# Patient Record
Sex: Male | Born: 1965 | Hispanic: No | Marital: Married | Smoking: Former smoker
Health system: Southern US, Community
[De-identification: ages and names within clinical notes are randomized; demographics above are authoritative.]

---

## 2005-04-26 ENCOUNTER — Ambulatory Visit (HOSPITAL_COMMUNITY): Admission: RE | Admit: 2005-04-26 | Discharge: 2005-04-26 | Payer: Self-pay | Admitting: *Deleted

## 2005-11-25 IMAGING — US US ABDOMEN COMPLETE
1 series · 14 of 25 positions shown · non-contrast
Comparison: None.

CLINICAL DATA: Chronic hepatitis.  
 UPPER ABDOMINAL ULTRASOUND COMPLETE:

[Series 1: unknown · 0.25mm/px · 14 of 90 slices shown]
[im 1/90]
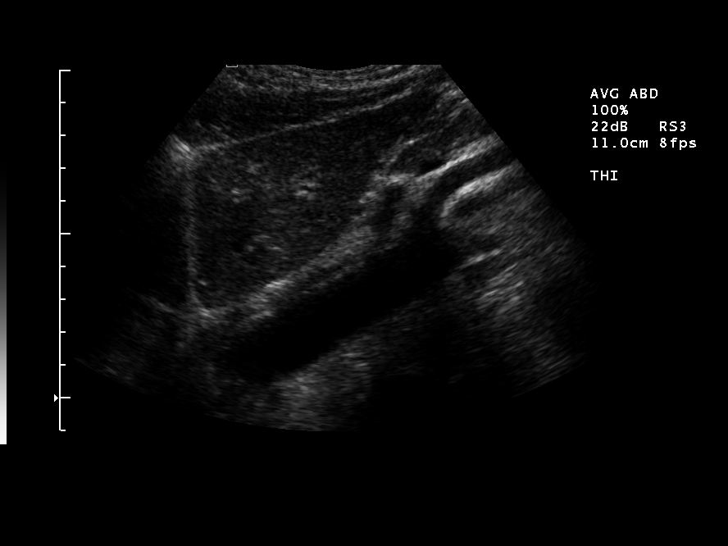
[im 8/90]
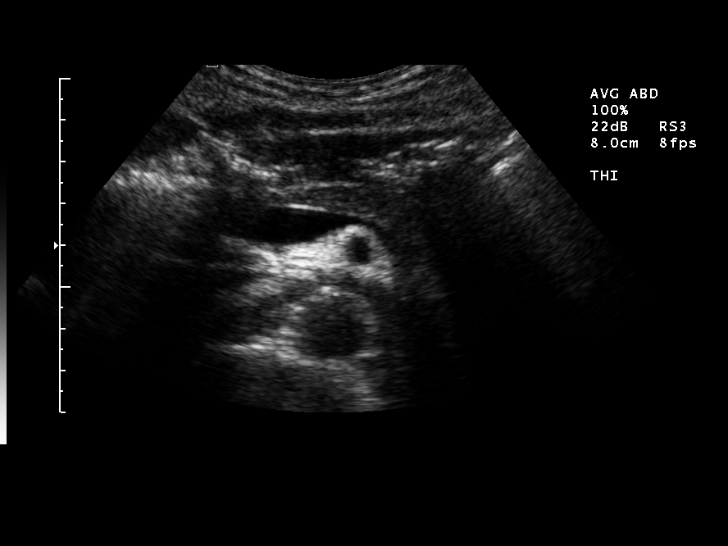
[im 15/90]
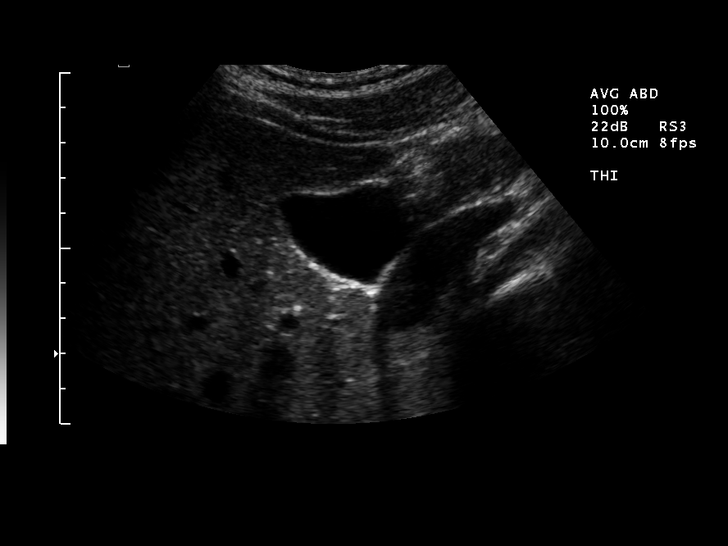
[im 23/90]
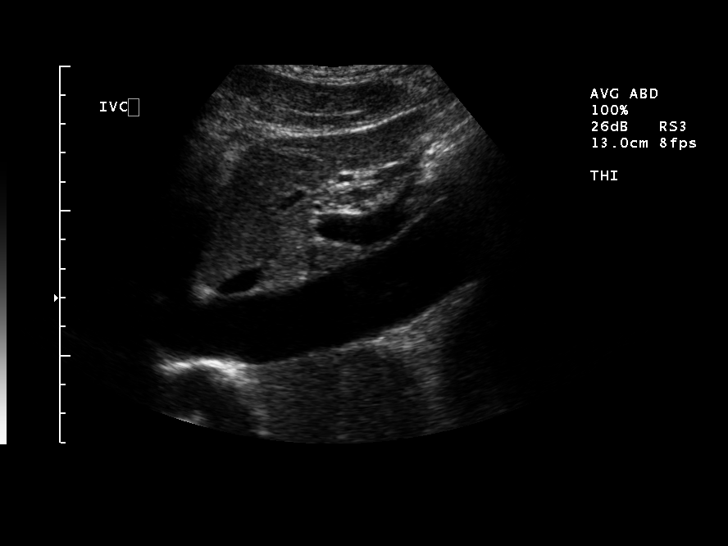
[im 30/90]
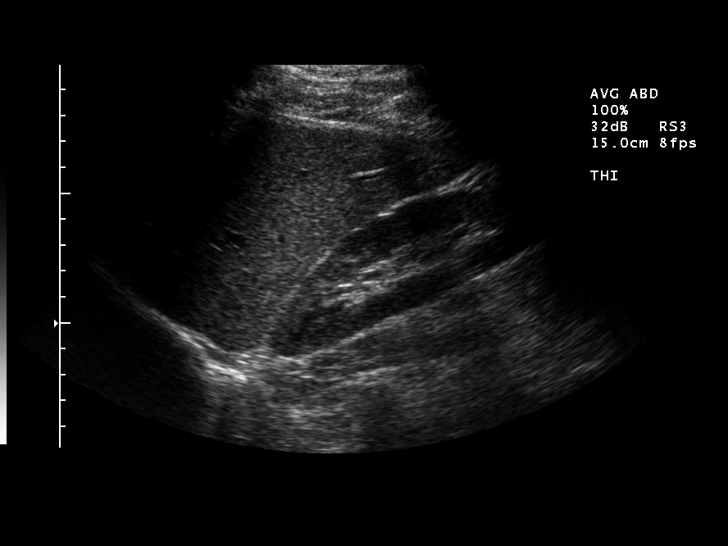
[im 34/90]
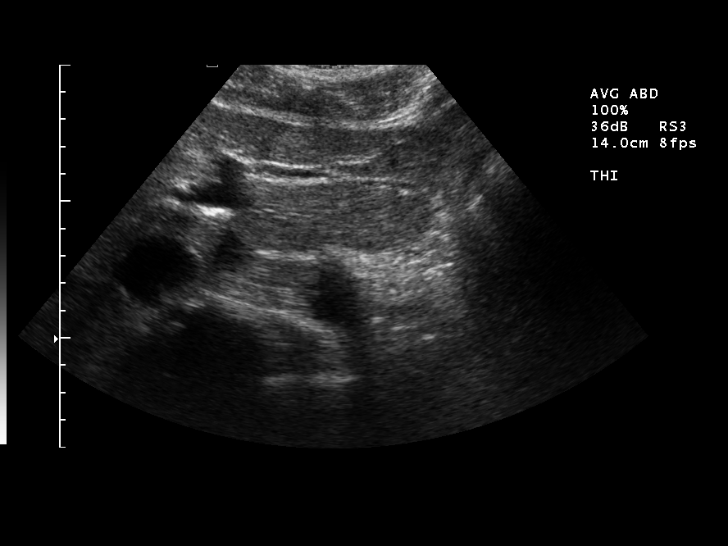
[im 41/90]
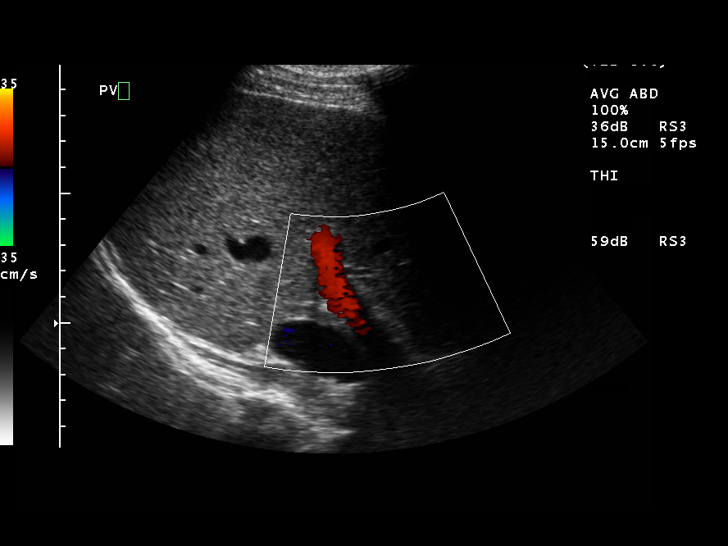
[im 49/90]
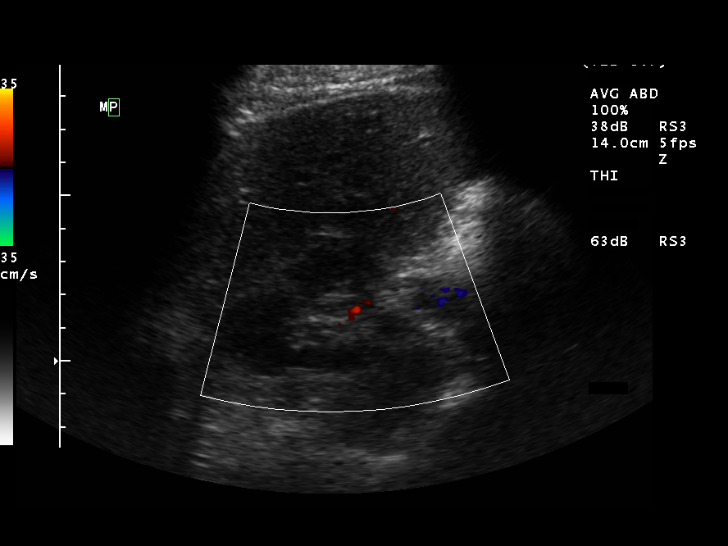
[im 56/90]
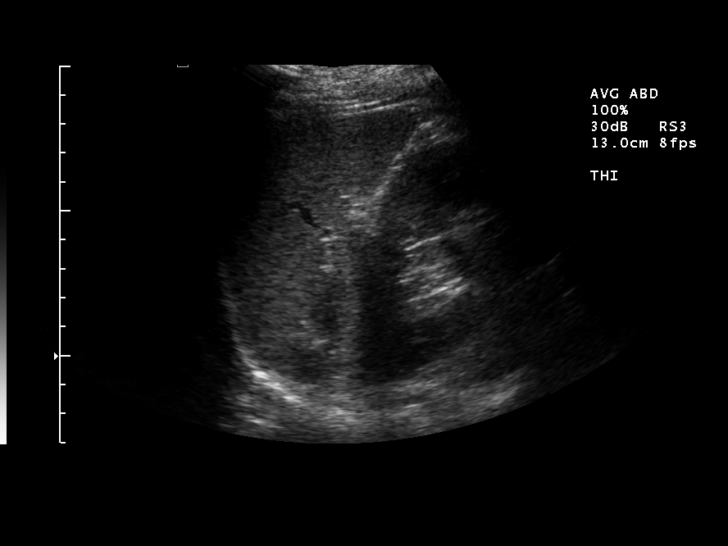
[im 60/90]
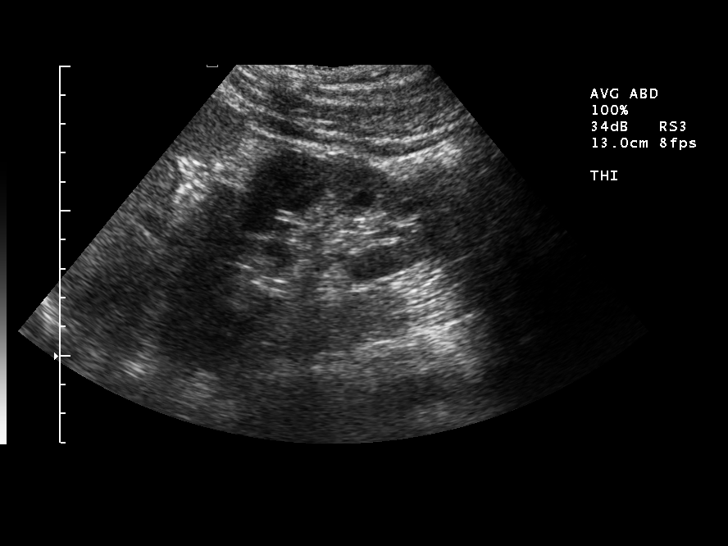
[im 67/90]
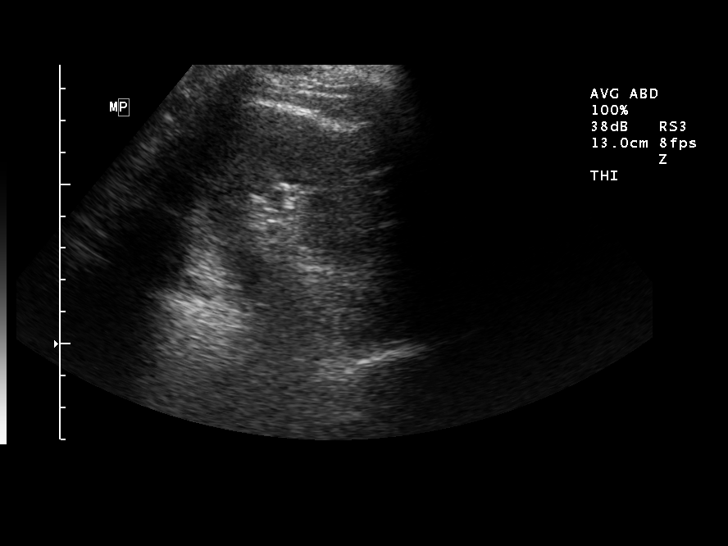
[im 75/90]
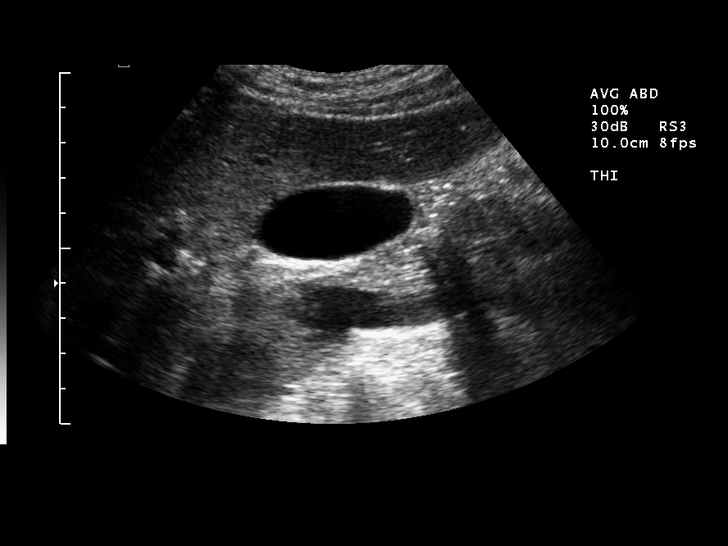
[im 82/90]
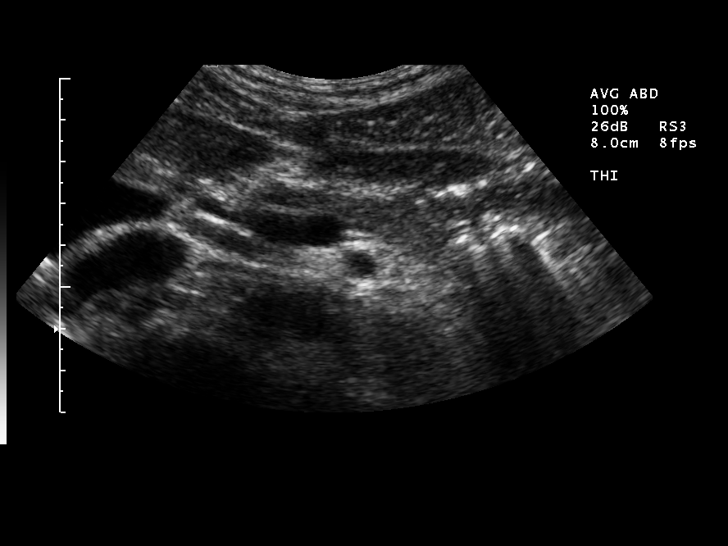
[im 90/90]
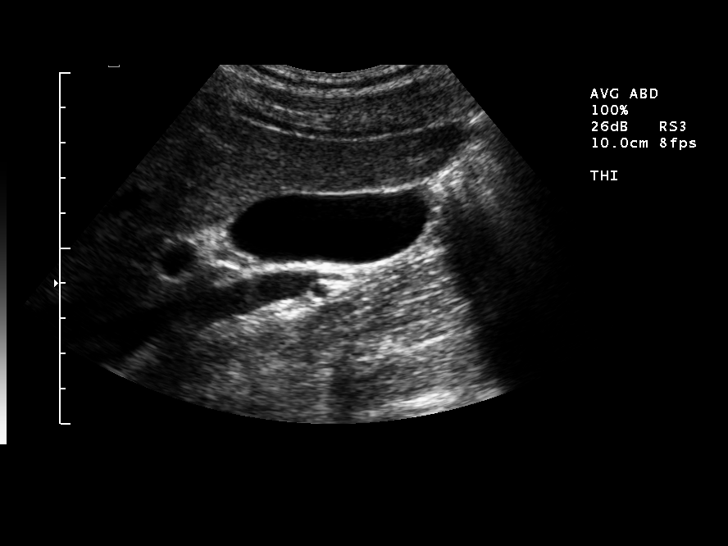

[14 of 25 positions shown; findings below may reference images not displayed]

FINDINGS: Gallbladder and biliary tree normal.  The common duct is 3.3 mm.  There appear to be no focal lesions of the liver. Echotexture is within normal limits.  Spleen, pancreas and kidneys normal.  IVC and aorta normal.  No ascites.
IMPRESSION: No acute or significant findings.

## 2009-05-15 IMAGING — US US ABDOMEN COMPLETE
1 series · 14 of 25 positions shown · non-contrast
Comparison: NONE

CLINICAL DATA: Hepatitis B. Prior study dated 01/15/02. 

ABDOMINAL ULTRASOUND

[Series 1: us abd · 0.23mm/px · 14 of 67 slices shown]
[im 1/67]
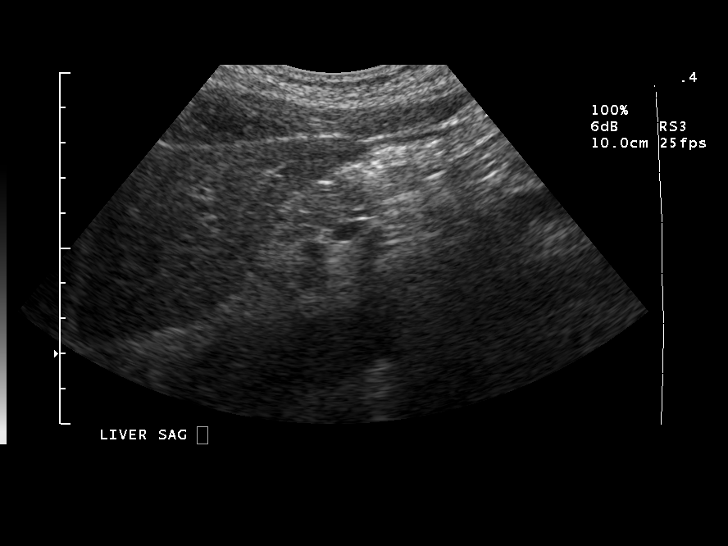
[im 6/67]
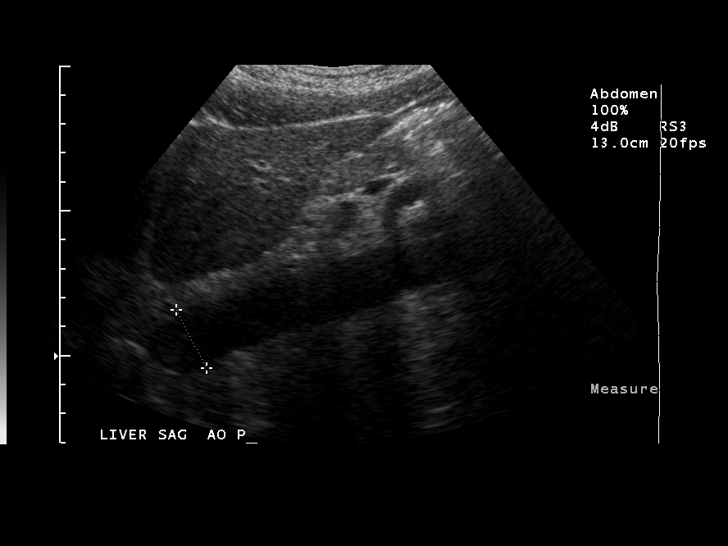
[im 12/67]
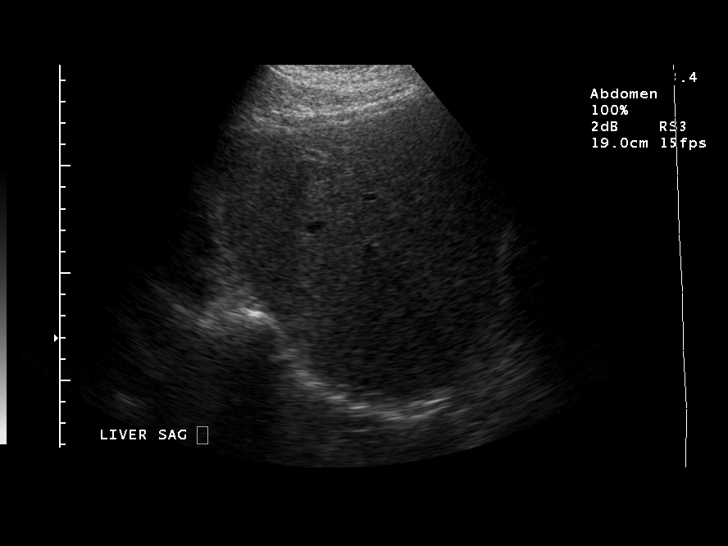
[im 17/67]
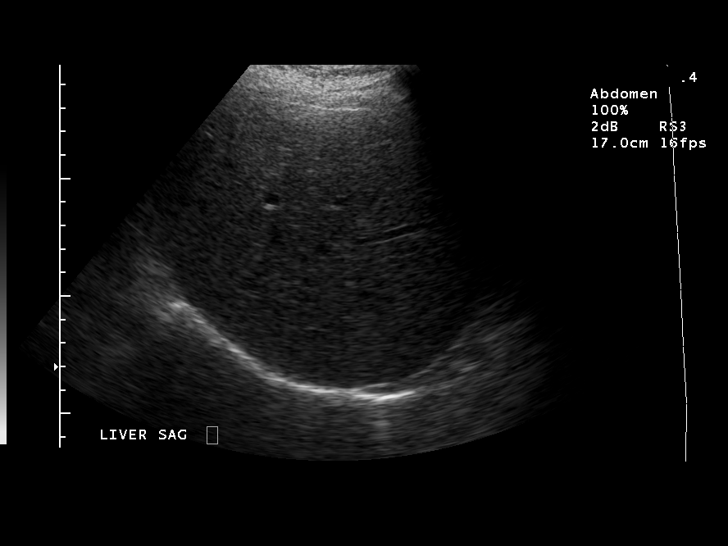
[im 23/67]
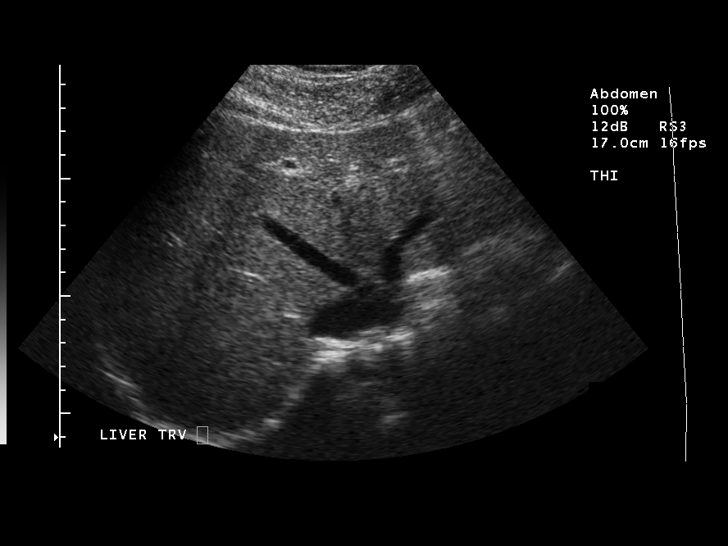
[im 25/67]
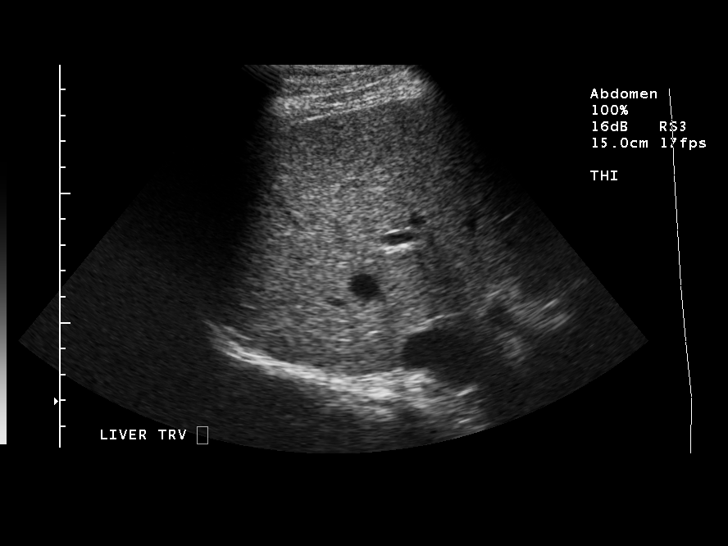
[im 31/67]
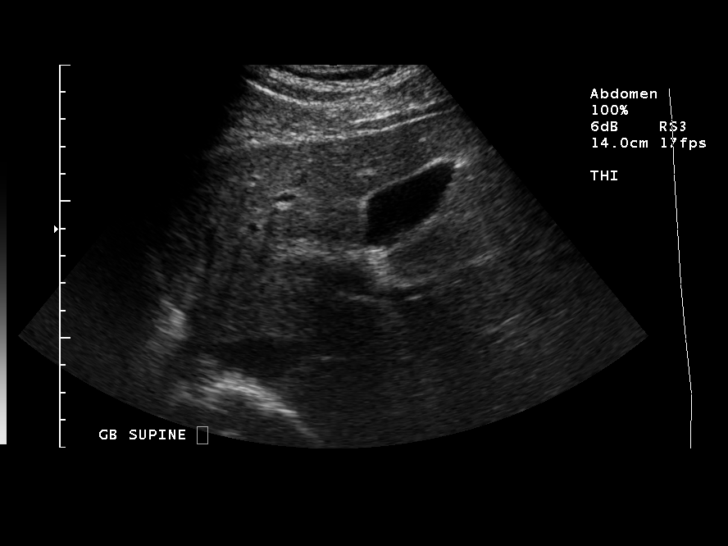
[im 36/67]
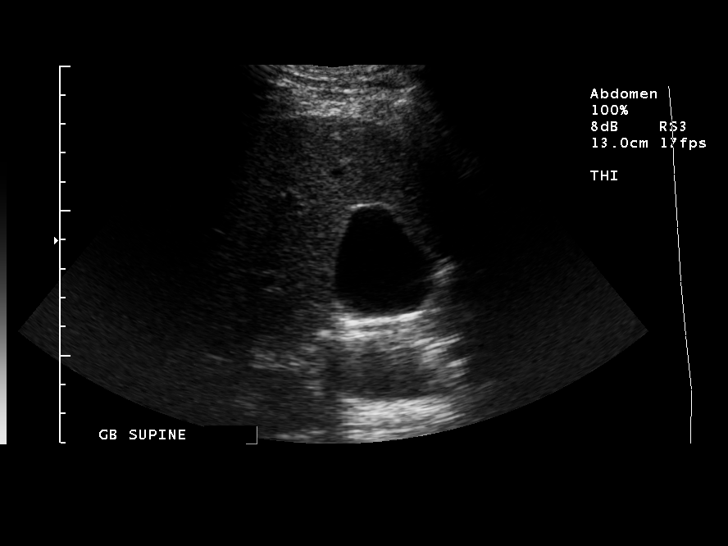
[im 42/67]
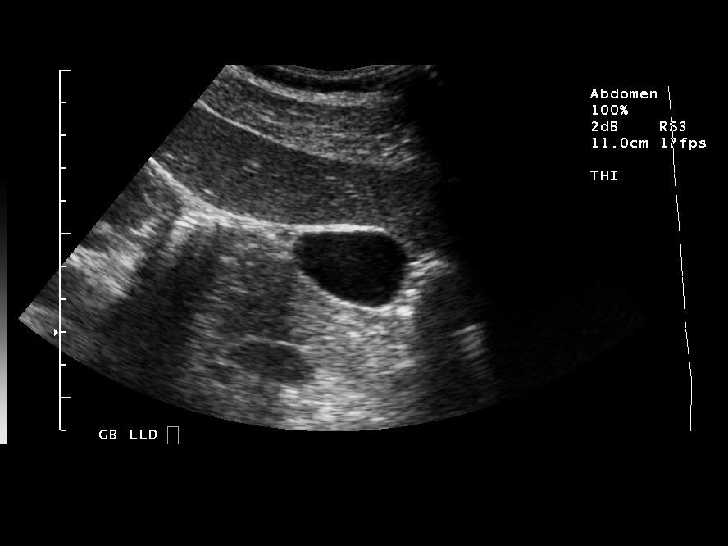
[im 45/67]
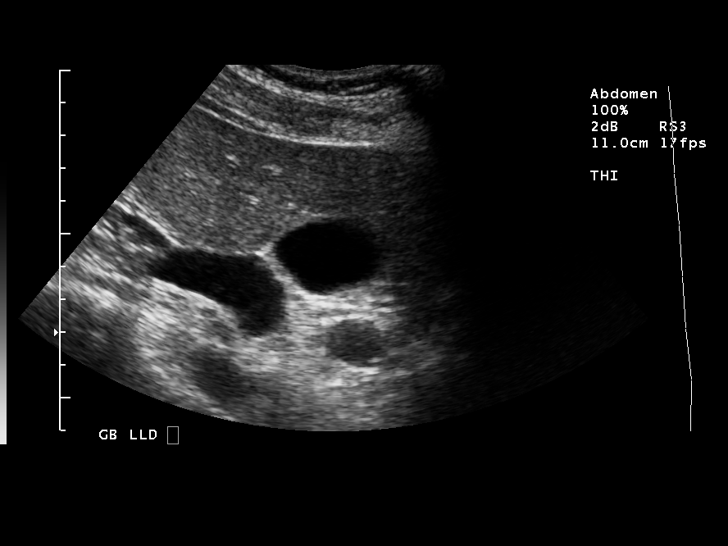
[im 50/67]
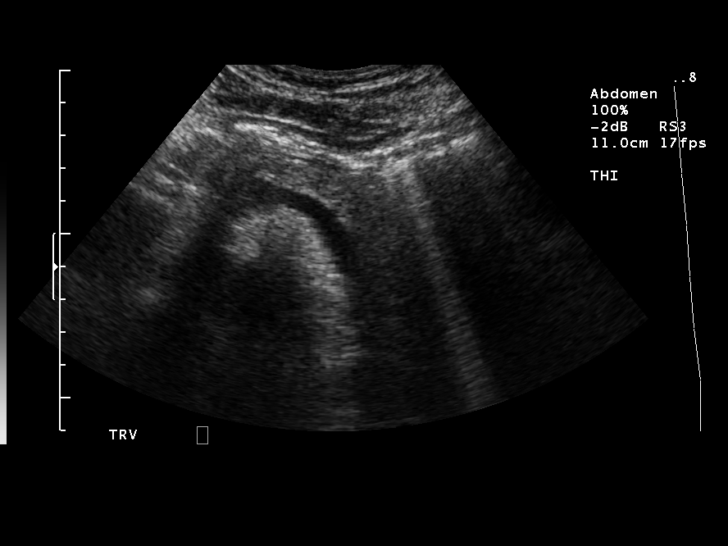
[im 56/67]
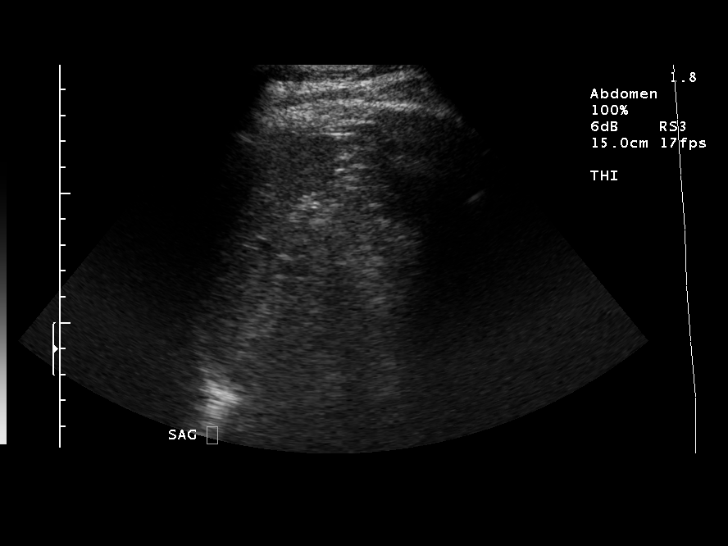
[im 61/67]
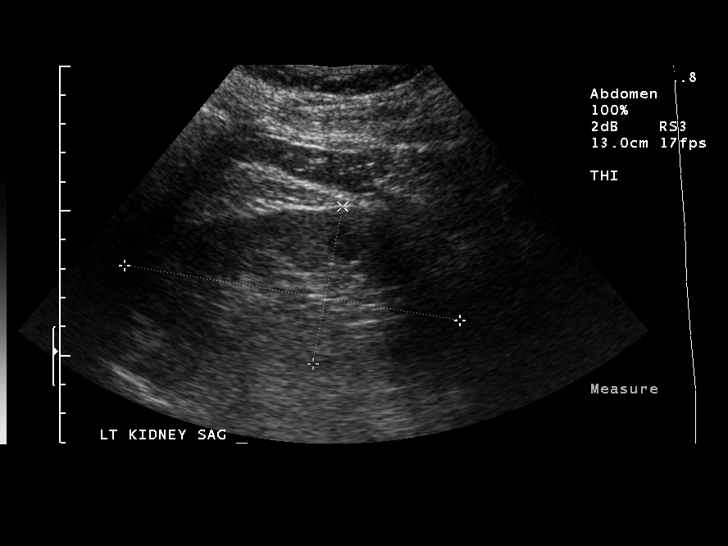
[im 67/67]
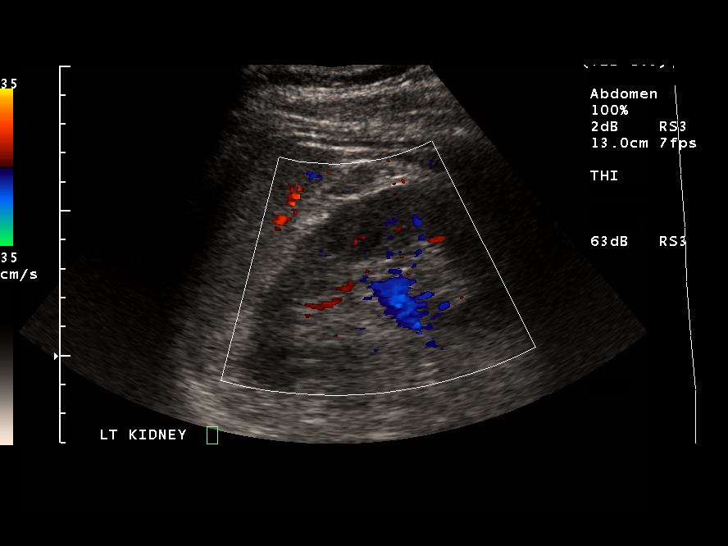

[14 of 25 positions shown; findings below may reference images not displayed]

FINDINGS: The prior study was reviewed. The liver is normal in 
size and echogenicity. Previously demonstrated 1.3-cm hyperechoic 
nodule is not identified on the current study. The gallbladder is 
normal in appearance without stones present. The common duct is 
non-dilated measuring 0.43 cm. The pancreas is not well 
demonstrated. The aorta is normal in caliber and echo appearance 
with AP measurements of 2.3 cm proximally, mid 1.8 cm, and 
distally 1.3 cm. Both kidneys are normal in size and echogenicity. 
The right kidney measures 12.4 cm in length, and the left kidney 
measures 11.7 cm in length. The spleen measures 10.4 cm with 
normal echo appearances.
IMPRESSION: Negative ultrasound evaluation of the abdomen. Fu 
Rauscher Gantois, M.D. electronically reviewed on 10/30/2007 Dict Date: 
10/29/2007  Tran Date: 10/30/2007 CAV  [REDACTED]

## 2018-11-27 ENCOUNTER — Ambulatory Visit (INDEPENDENT_AMBULATORY_CARE_PROVIDER_SITE_OTHER): Payer: 59 | Admitting: Psychiatry

## 2018-11-27 ENCOUNTER — Encounter: Payer: Self-pay | Admitting: Psychiatry

## 2018-11-27 VITALS — BP 130/92 | HR 76 | Ht 73.0 in | Wt 197.0 lb

## 2018-11-27 DIAGNOSIS — F411 Generalized anxiety disorder: Secondary | ICD-10-CM | POA: Insufficient documentation

## 2018-11-27 DIAGNOSIS — F341 Dysthymic disorder: Secondary | ICD-10-CM | POA: Diagnosis not present

## 2018-11-27 MED ORDER — DULOXETINE HCL 30 MG PO CPEP: 30.0000 mg | ORAL_CAPSULE | Freq: Two times a day (BID) | ORAL | 2 refills | Status: DC

## 2018-11-27 NOTE — Progress Notes (Signed)
[facility]  Patient ID: Andrew Conner,  MRN: [id number]  PCP: No primary care provider on file.  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  He stopped alcohol which had been more frequent before the last appointment attributing this to Cymbalta adjusted down therefore.  Though prescription has allowed 30 mg twice daily, he finds that once every morning only is sufficient and not creating diathesis to disruptive behavior.  He stops himself when angry before any acting out and has no consequences other than his own negative self-concept and remorse.  He has lost weight by dieting 9 pounds which he attributes to excessive caffeine though having somnolence. Sleep is good, and he awakes in the morning easily.  Blood pressure has remained upper limit of normal being slightly elevated today despite his weight loss and low dose of Cymbalta overall.  There is history of dysthymia in a cousin and son, and the cousin had suicide risk at one time now resolved.  Depression       The patient presents with depression.  This is a chronic problem.  The current episode started more than 1 year ago.   The onset quality is gradual.   The problem occurs intermittently.  The problem has been gradually improving since onset.  Associated symptoms include irritable.  Associated symptoms include no decreased concentration, no fatigue, no helplessness, no hopelessness, does not have insomnia, no decreased interest, no appetite change, no headaches, not sad and no suicidal ideas.     The symptoms are aggravated by work stress, social issues and family  issues.  Past treatments include SSRIs - Selective serotonin reuptake inhibitors, SNRIs - Serotonin and norepinephrine reuptake inhibitors and other medications.  Compliance with treatment is good.  Past compliance problems include medication issues and difficulty with treatment plan.  Previous treatment provided significant relief.  Risk factors include family history, family history of mental illness, major life event and stress.   Past medical history includes anxiety, depression and mental health disorder.     Pertinent negatives include no chronic illness, no life-threatening condition, no physical disability, no recent psychiatric admission, no bipolar disorder, no eating disorder, no obsessive-compulsive disorder, no post-traumatic stress disorder, no schizophrenia, no suicide attempts and no head trauma.   Individual Medical History/ Review of Systems: Changes? :Yes After 4-1/2 years of treatment for dysthymia with anxious distress and GAD, he has much less anxious today having youthful cosmesis and attire as hair is getting more gray with his complaint that he has anger too easily as though anxiety is in remission allowing depressive overloads to be potentially relatively acted out.  He does not manifest mania, psychosis, substance use concern or consequences, or any trauma related dissociation.  Allergies: Hydrocortisone  Current Medications:  Current Outpatient Medications:  .  DULoxetine (CYMBALTA) 30 MG capsule, Take 30 mg by mouth 2 (two) times daily., Disp: , Rfl:    Medication Side Effects: none  Family Medical/ Social History: Changes? No  MENTAL HEALTH EXAM: Muscle strengths and tone 5/5, postural reflexes and gait 0/0, and AIMS = 0. Blood pressure (!) 130/92, pulse 76, height 6\' 1"  (1.854 m), weight 197 lb (89.4 kg).Body mass  index is 25.99 kg/m.  General Appearance: Casual and Fairly Groomed  Eye Contact:  Good  Speech:  Clear and Coherent and Normal Rate  Volume:  Normal   Mood:  Dysphoric and Euthymic  Affect:  Appropriate and Labile  Thought Process:  Goal Directed and Linear  Orientation:  Full (Time, Place, and Person)  Thought Content: Rumination   Suicidal Thoughts:  No  Homicidal Thoughts:  No  Memory:  Immediate;   Good Remote;   Good  Judgement:  Good  Insight:  Fair  Psychomotor Activity:  Normal, Increased and Mannerisms  Concentration:  Concentration: Fair and Attention Span: Good  Recall:  Good  Fund of Knowledge: Good  Language: Fair  Assets:  Resilience Social Support Talents/Skills  ADL's:  Intact  Cognition: WNL  Prognosis:  Good    DIAGNOSES:    ICD-10-CM   1. 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 Generalized anxiety disorder F41.1     Receiving Psychotherapy: Yes     RECOMMENDATIONS: Cymbalta is escribed 30 mg every morning and evening #60 with 2 refills though he most often uses just in the morning except for episodic decompensations of mood that warrant an increase.  His anxiety and behavior are sufficiently improved after 4 years of treatment so that he has less suppression of anger and frustration as he does not have psychotherapy or mentor activities in which to dissipate such cumulative anger.  We discussed Intuniv as an option for treatment of the anger if needed starting at 1 mg daily, though not prescribed today as he formulates how to manage behaviorally himself, to return in 6 months.   [name], MD

## 2019-05-12 ENCOUNTER — Other Ambulatory Visit: Payer: Self-pay | Admitting: Psychiatry

## 2019-05-12 DIAGNOSIS — F341 Dysthymic disorder: Secondary | ICD-10-CM

## 2019-05-12 DIAGNOSIS — F411 Generalized anxiety disorder: Secondary | ICD-10-CM

## 2019-06-04 ENCOUNTER — Other Ambulatory Visit: Payer: Self-pay

## 2019-06-04 ENCOUNTER — Encounter: Payer: Self-pay | Admitting: Psychiatry

## 2019-06-04 ENCOUNTER — Ambulatory Visit (INDEPENDENT_AMBULATORY_CARE_PROVIDER_SITE_OTHER): Payer: 59 | Admitting: Psychiatry

## 2019-06-04 VITALS — Ht 73.0 in | Wt 200.0 lb

## 2019-06-04 DIAGNOSIS — F411 Generalized anxiety disorder: Secondary | ICD-10-CM

## 2019-06-04 DIAGNOSIS — F341 Dysthymic disorder: Secondary | ICD-10-CM

## 2019-06-04 MED ORDER — DULOXETINE HCL 30 MG PO CPEP: 30.0000 mg | ORAL_CAPSULE | Freq: Two times a day (BID) | ORAL | 3 refills | Status: DC

## 2019-06-04 NOTE — Progress Notes (Signed)
[facility]  Patient ID: Andrew Conner,  MRN: [id number]  PCP: Patient, No Pcp Per  Date of Evaluation: 06/04/2019 Time spent:10 minutes from 0905 to 0915 Chief Complaint:  Chief Complaint    Depression; Anxiety; Agitation      HISTORY/CURRENT STATUS: Andrew Conner is seen onsite in office face-to-face individually with consent with epic collateral for psychiatric interview and exam in 91-month evaluation and management of dysthymia and generalized anxiety both now in partial remission.  He continues to most often prefer Cymbalta 30 mg just once daily as otherwise his emotions are too blunted, though last appointment he discussed needing help with his easy anger though he now better understands both his anger, inhibition and shutting down. Efficiency and focus in daily activities are optimal when emotions are best regulated.  He still takes occasional twice daily Cymbalta but never did request Intuniv from last session.  Exercise helps, and he has self developed 12-step type awareness for his emotion and activity such as anger regulation, though he does worry that at times he will drink 4 to 5 glasses of wine a day for 1 to 2 weeks and then stop again.  Thorough review does not clarify alcohol use disorder of any significance.  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  We did send a refill for Cymbalta to [facility] 05/12/2019, and the patient concludes the session that he is overall doing well and can manage the issues he mobilizes today as an last session to return in 6 months as usual.  He has no mania, suicidality, psychosis, or delirium.   Individual Medical History/ Review of Systems: Changes? :No   Allergies: Hydrocortisone  Current Medications:  Current Outpatient Medications:  .  DULoxetine (CYMBALTA) 30 MG capsule,  Take 1 capsule (30 mg total) by mouth 2 (two) times daily., Disp: 60 capsule, Rfl: 3   Medication Side Effects: none  Family Medical/ Social History: Changes? No  MENTAL HEALTH EXAM:  Height 6\' 1"  (1.854 m), weight 200 lb (90.7 kg).Body mass index is 26.39 kg/m.  Others deferred for coronavirus pandemic  General Appearance: Casual, Fairly Groomed and Meticulous  Eye Contact:  Fair  Speech:  Clear and Coherent, Normal Rate and Talkative  Volume:  Normal  Mood:  Anxious, Dysphoric and Euthymic  Affect:  Inappropriate, Labile, Full Range and Anxious  Thought Process:  Coherent, Goal Directed and Irrelevant  Orientation:  Full (Time, Place, and Person)  Thought Content: Obsessions and Rumination   Suicidal Thoughts:  No  Homicidal Thoughts:  No  Memory:  Immediate;   Good Remote;   Good  Judgement:  Fair  Insight:  Fair  Psychomotor Activity:  Normal and Mannerisms  Concentration:  Concentration: Fair and Attention Span: Good  Recall:  Good  Fund of Knowledge: Good  Language: Fair  Assets:  Desire for Improvement Intimacy Leisure Time Resilience  ADL's:  Intact  Cognition: WNL  Prognosis:  Good    DIAGNOSES:    ICD-10-CM   1. 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 Generalized anxiety disorder  F41.1 DULoxetine (CYMBALTA) 30 MG capsule    Receiving Psychotherapy: No     RECOMMENDATIONS: He is E scribed Cymbalta 30 mg twice daily #60 with  [facility] for persistent depression and generalized anxiety both in partial remission.  We rework the associated emotional valence, repertoire of activity, and dynamic maturation over time as he also wishes to make such available to son when son is interested and can benefit, as he sees son being depressed at times but not necessarily willing to acknowledge it.  The patient  acquires understanding and capacity for conflict resolution and problem solving as care continues, to return here in 6 months for follow-up.    Chauncey MannGlenn E Jennings, MD

## 2019-12-01 ENCOUNTER — Ambulatory Visit: Payer: 59 | Admitting: Psychiatry

## 2019-12-09 ENCOUNTER — Ambulatory Visit (INDEPENDENT_AMBULATORY_CARE_PROVIDER_SITE_OTHER): Payer: 59 | Admitting: Psychiatry

## 2019-12-09 ENCOUNTER — Other Ambulatory Visit: Payer: Self-pay

## 2019-12-09 ENCOUNTER — Encounter: Payer: Self-pay | Admitting: Psychiatry

## 2019-12-09 VITALS — Ht 73.0 in | Wt 208.0 lb

## 2019-12-09 DIAGNOSIS — F411 Generalized anxiety disorder: Secondary | ICD-10-CM | POA: Diagnosis not present

## 2019-12-09 DIAGNOSIS — F341 Dysthymic disorder: Secondary | ICD-10-CM | POA: Diagnosis not present

## 2019-12-09 MED ORDER — DULOXETINE HCL 40 MG PO CPEP: 40.0000 mg | ORAL_CAPSULE | Freq: Every day | ORAL | 5 refills | Status: DC

## 2019-12-09 NOTE — Progress Notes (Signed)
[facility]  Patient ID: Andrew Conner,  MRN: [id number]  PCP: Patient, No Pcp Per  Date of Evaluation: 12/09/2019 Time spent:15 minutes from 0900 to 0915  Chief Complaint:  Chief Complaint    Depression; Anxiety      HISTORY/CURRENT STATUS: Andrew Conner is seen onsite in office 15 minutes face-to-face individually with consent with epic collateral for adolescent psychiatric interview and exam in 44-month evaluation and management of dysthymic disorder and generalized anxiety.  Patient reviews as in the past the extended time course of his realization and application of self-help psychotherapeutic and medication interventions for his chronic depression.  He continue Cymbalta taking 30 mg in the morning and several times weekly at 30 mg in the evening finding variability still in the mood that requires prevention from exacerbation as well as day-to-day management for optimal efficacy himself in daily life for family and job.  In that way we process the possibility of switching to 40 mg once a day as he wishes there was an in between dose understanding the cost differential of the 40 mg product in the past.  He has gained 8 pounds in the interim 6 months stating he often drinks a couple of beers daily now having a bottle of wine on the weekend enjoying the taste but not liking any inebriation.  Weight had been 206 pounds in September 2019 down to 200 pounds last appointment.  His exercise is likely last through the winter.  He is concerned that his son has 2 to 3 years of depressive symptoms similar to himself for which he suffers from the compensations that are ineffective and patient wishes for son to become more cognitively capable and to consider role of treatment in the process though not attempting to define son to have the same problem as himself but possible.  He approves of son being seen here if son agrees.  Patient has no mania, suicidality, psychosis or delirium.   Individual  Medical History/ Review of Systems: Changes? :Yes Apparently missed appointment was not an indicator of relapse or exacerbation.  Patient has gained weight from 200 to 208 pounds in the interim for the winter now intervening himself into more weight control.  Allergies: Hydrocortisone  Current Medications:  Current Outpatient Medications:  .  DULoxetine 40 MG CPEP, Take 40 mg by mouth daily after breakfast., Disp: 30 capsule, Rfl: 5  Medication Side Effects: none  Family Medical/ Social History: Changes? Yes concerned that his son has 2 to 3 years of depressive symptoms similar to himself  Suffering from the compensations that are ineffective needing more cognitive resource to consider role of treatment in the process   MENTAL HEALTH EXAM:  Height 6\' 1"  (1.854 m), weight 208 lb (94.3 kg).Body mass index is 27.44 kg/m. Muscle strengths and tone 5/5, postural reflexes and gait 0/0, and AIMS = 0 otherwise deferred for coronavirus shutdown  General Appearance: Casual, Fairly Groomed and Meticulous  Eye Contact:  Good to fair  Speech:  Clear and Coherent, Normal Rate and Talkative  Volume:  Normal  Mood:  Anxious, Dysphoric and Euthymic  Affect:  Congruent, Inappropriate, Restricted and Anxious  Thought Process:  Coherent, Goal Directed, Irrelevant and Descriptions of Associations: Tangential and Circumstantial  Orientation:  Full (Time, Place, and Person)  Thought Content: Obsessions, Rumination and Tangential   Suicidal Thoughts:  No  Homicidal Thoughts:  No  Memory:  Immediate;   Good Remote;   Good  Judgement:  Good  Insight:  Good  Psychomotor Activity:  Normal, Decreased and Mannerisms  Concentration:  Concentration: Fair and Attention Span: Good  Recall:  Good  Fund of Knowledge: Good  Language: Good  Assets:  Desire for Improvement Leisure Time Resilience Vocational/Educational  ADL's:  Intact  Cognition: WNL  Prognosis:  Good    DIAGNOSES:    ICD-10-CM   1. Severe  late onset persistent depressive disorder in partial remission with anxious distress and pure persistent depressive syndrome  F34.1 DULoxetine 40 MG CPEP  2. Generalized anxiety disorder  F41.1 DULoxetine 40 MG CPEP    Receiving Psychotherapy: No    RECOMMENDATIONS: Psychosupportive psychoeducation reworks cognitive behavioral nutrition, object relations, and frustration management interventions for prevention, monitoring, and safety hygiene in symptom treatment matching.  Patient requests to undertake the 40 mg Cymbalta every morning #30 with 5 refills sent by eScription to [facility] pharmacy but can return to the 30 mg twice daily dosing if cost is prohibitive for dysthymia and generalized anxiety.  He processes his hope and help for son.  He returns for follow-up in 6 months or sooner if needed.   [name], MD

## 2020-06-10 ENCOUNTER — Telehealth: Payer: Self-pay | Admitting: Psychiatry

## 2020-06-10 ENCOUNTER — Encounter: Payer: Self-pay | Admitting: Psychiatry

## 2020-06-10 ENCOUNTER — Telehealth (INDEPENDENT_AMBULATORY_CARE_PROVIDER_SITE_OTHER): Payer: 59 | Admitting: Psychiatry

## 2020-06-10 DIAGNOSIS — F341 Dysthymic disorder: Secondary | ICD-10-CM

## 2020-06-10 DIAGNOSIS — F411 Generalized anxiety disorder: Secondary | ICD-10-CM

## 2020-06-10 MED ORDER — DULOXETINE HCL 30 MG PO CPEP: 30.0000 mg | ORAL_CAPSULE | Freq: Two times a day (BID) | ORAL | 11 refills | Status: DC

## 2020-06-10 NOTE — Progress Notes (Signed)
[facility]  Patient ID: OPIE MACLAUGHLIN,  MRN: [id number]  PCP: Patient, No Pcp Per  Date of Evaluation: 06/10/2020 Time spent:15 minutes from 0900 to 0915  Chief Complaint:  Chief Complaint    Depression; Anxiety      HISTORY/CURRENT STATUS: Che is provided telemedicine audiovisual appointment session as MyChart video visit with telehealth formal consent documented 15 minutes individually with epic collateral for psychiatric interview and exam in 80-month evaluation and management of dysthymia and generalized anxiety with interim need for restoring Cymbalta back from 40 to 60 mg after 10 mg increase last appointment.  Patient's greatest source of distress in the last year has been his son's 2 to 3-year history as a senior in early college of depressive symptoms similar to himself undermining family relations.  Patient's participation in son's care has been exhausting as son is resistant likely similar to the patient's early difficulty gaining stabilization in extended treatment.  The patient has done very well in his care here now of exactly 5 years neeing adjustments and Cymbalta once established but otherwise working through with supportive therapy at the appointments a sense of success even in trying to help his son who comes in next week again.  He has no adverse effects.  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  Anxiety and depression are manageable but remission of symptoms is now considered ideal.  He has no mania, suicidality, psychosis or delirium.   Individual Medical History/ Review of Systems: Changes? :No   Allergies: Hydrocortisone  Current Medications:  Current Outpatient Medications:  .  DULoxetine (CYMBALTA) 30 MG capsule, Take 1 capsule (30 mg total) by mouth 2 (two) times daily., Disp: 60 capsule, Rfl: 11  Medication Side Effects: none  Family Medical/ Social History: Changes? Yes with his son Ronaldo Miyamoto now  in treatment here as well.  MENTAL HEALTH EXAM:  There were no vitals taken for this visit.There is no height or weight on file to calculate BMI.  Not present here today but by video AIMS = 0  General Appearance: Casual, Fairly Groomed and Meticulous  Eye Contact:  Good  Speech:  Clear and Coherent, Normal Rate and Talkative  Volume:  Normal  Mood:  Anxious, Dysphoric, Euthymic and Irritable  Affect:  Congruent, Inappropriate, Full Range and Anxious  Thought Process:  Coherent, Goal Directed and Descriptions of Associations: Circumstantial  Orientation:  Full (Time, Place, and Person)  Thought Content: Obsessions and Rumination   Suicidal Thoughts:  No  Homicidal Thoughts:  No  Memory:  Immediate;   Good Remote;   Good  Judgement:  Good  Insight:  Good  Psychomotor Activity:  Normal and Mannerisms  Concentration:  Concentration: Fair and Attention Span: Good  Recall:  Good  Fund of Knowledge: Good  Language: Good  Assets:  Desire for Improvement Leisure Time Resilience Vocational/Educational  ADL's:  Intact  Cognition: WNL  Prognosis:  Good    DIAGNOSES:    ICD-10-CM   1. 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 Generalized anxiety disorder  F41.1 DULoxetine (CYMBALTA) 30 MG capsule    Receiving Psychotherapy: No    RECOMMENDATIONS: Psychosupportive psychoeducation integrates patient's self-help skills in cognitive behavioral nutrition, sleep hygiene, social problem-solving and frustration management with symptom treatment matching including especially with the course of several months of partially successful treatment  of son Ronaldo Miyamoto for depression emphasizing the need for remission for the patient.  Cymbalta is increased to 30 mg twice daily usually taking both in the morning after breakfast when adapted to the increased dose sent as #60  with 11 refills to [facility] pharmacy for dysthymia and generalized anxiety.  He is updated on prevention and monitoring safety hygiene as well as closure for my retirement for transfer to advanced practitioner in the office for follow up in 6 to 12 months  Virtual Visit via Video Note  I connected with [name] on 06/10/20 at  9:00 AM EDT by a video enabled telemedicine application and verified that I am speaking with the correct person using two identifiers.  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 The patient expressed understanding and agreed to proceed.  History of Present Illness: 18-month evaluation and management of dysthymia and generalized anxiety with interim need for restoring Cymbalta back from 40 to 60 mg after 10 mg increase last appointment.  Patient's greatest source of distress in the last year has been his son's 2 to 3-year history as a senior in early college of depressive symptoms similar to himself undermining family relations   Observations/Objective: Mood:  Anxious, Dysphoric, Euthymic and Irritable  Affect:  Congruent, Inappropriate, Full Range and Anxious  Thought Process:  Coherent, Goal Directed and Descriptions of Associations: Circumstantial  Orientation:  Full (Time, Place, and Person)  Thought Content: Obsessions and Rumination    Assessment and Plan: Psychosupportive psychoeducation integrates patient's self-help skills in cognitive behavioral nutrition, sleep hygiene, social problem-solving and frustration management with symptom treatment matching including especially with the course of several months of partially successful treatment of son Ronaldo Miyamoto for depression emphasizing the need for remission for the patient.  Cymbalta is  increased to 30 mg twice daily usually taking both in the morning after breakfast when adapted to the increased dose sent as #60 with 11 refills to [facility] pharmacy for dysthymia and generalized anxiety.   Follow Up Instructions: He is updated on prevention and monitoring safety hygiene as well as closure for my retirement for transfer to advanced practitioner in the office for follow up in 6 to 12 months   I discussed the assessment and treatment plan with the patient. The patient was provided an opportunity to ask questions and all were answered. The patient agreed with the plan and demonstrated an understanding of the instructions.   The patient was advised to call back or seek an in-person evaluation if the symptoms worsen or if the condition fails to improve as anticipated.  I provided 15 minutes of non-face-to-face time during this encounter. [email or web address]  [name], MD  [name], MD

## 2020-06-10 NOTE — Telephone Encounter (Signed)
[name], gorr are scheduled for a virtual visit with your provider today.    Just as we do with appointments in the office, we must obtain your consent to participate.  Your consent will be active for this visit and any virtual visit you may have with one of our providers in the next 365 days.    If you have a MyChart account, I can also send a copy of this consent to you electronically.  All virtual visits are billed to your insurance company just like a traditional visit in the office.  As this is a virtual visit, video technology does not allow for your provider to perform a traditional examination.  This may limit your provider's ability to fully assess your condition.  If your provider identifies any concerns that need to be evaluated in person or the need to arrange testing such as labs, EKG, etc, we will make arrangements to do so.    Although advances in technology are sophisticated, we cannot ensure that it will always work on either your end or our end.  If the connection with a video visit is poor, we may have to switch to a telephone visit.  With either a video or telephone visit, we are not always able to ensure that we have a secure connection.   I need to obtain your verbal consent now.   Are you willing to proceed with your visit today?   Andrew Conner has provided verbal consent on 06/10/2020 for a virtual visit (video or telephone).   [name], MD 06/10/2020  9:09 AM

## 2020-06-16 ENCOUNTER — Telehealth: Payer: Self-pay

## 2020-06-16 NOTE — Telephone Encounter (Signed)
Prior authorization submitted and approved for DULOXETINE 30 MG #60. Effective 06/11/2020-06/11/2021 with [facility] PA# [id number]

## 2020-07-29 ENCOUNTER — Encounter: Payer: Self-pay | Admitting: Psychiatry

## 2021-03-03 DIAGNOSIS — J069 Acute upper respiratory infection, unspecified: Secondary | ICD-10-CM | POA: Diagnosis not present

## 2021-03-03 DIAGNOSIS — R0981 Nasal congestion: Secondary | ICD-10-CM | POA: Diagnosis not present

## 2021-03-03 DIAGNOSIS — F900 Attention-deficit hyperactivity disorder, predominantly inattentive type: Secondary | ICD-10-CM | POA: Diagnosis not present

## 2021-03-03 DIAGNOSIS — J209 Acute bronchitis, unspecified: Secondary | ICD-10-CM | POA: Diagnosis not present

## 2021-06-27 ENCOUNTER — Other Ambulatory Visit: Payer: Self-pay | Admitting: Psychiatry

## 2021-06-27 DIAGNOSIS — F341 Dysthymic disorder: Secondary | ICD-10-CM

## 2021-06-27 DIAGNOSIS — F411 Generalized anxiety disorder: Secondary | ICD-10-CM

## 2021-06-30 ENCOUNTER — Telehealth: Payer: Self-pay | Admitting: Behavioral Health

## 2021-06-30 ENCOUNTER — Other Ambulatory Visit: Payer: Self-pay | Admitting: Behavioral Health

## 2021-06-30 DIAGNOSIS — F341 Dysthymic disorder: Secondary | ICD-10-CM

## 2021-06-30 DIAGNOSIS — F411 Generalized anxiety disorder: Secondary | ICD-10-CM

## 2021-06-30 MED ORDER — DULOXETINE HCL 30 MG PO CPEP: 30.0000 mg | ORAL_CAPSULE | Freq: Two times a day (BID) | ORAL | 0 refills | Status: DC

## 2021-06-30 NOTE — Telephone Encounter (Signed)
#  60  Duloxetine 30 mg twice daily sent to [facility] to bridge until appt.  Please advise pt.

## 2021-06-30 NOTE — Telephone Encounter (Signed)
Please review

## 2021-06-30 NOTE — Telephone Encounter (Signed)
Pt informed

## 2021-06-30 NOTE — Telephone Encounter (Signed)
Former patient of GJ scheduled with BW  9/27. He came into office with refill request for Duloxetine 30mg . Ph: [phone]. Pharmacy [facility] [address] [place], [place].

## 2021-07-05 ENCOUNTER — Ambulatory Visit: Payer: BC Managed Care – PPO | Admitting: Behavioral Health

## 2021-07-05 ENCOUNTER — Encounter: Payer: Self-pay | Admitting: Behavioral Health

## 2021-07-05 ENCOUNTER — Other Ambulatory Visit: Payer: Self-pay

## 2021-07-05 ENCOUNTER — Ambulatory Visit (INDEPENDENT_AMBULATORY_CARE_PROVIDER_SITE_OTHER): Payer: BC Managed Care – PPO | Admitting: Behavioral Health

## 2021-07-05 DIAGNOSIS — F341 Dysthymic disorder: Secondary | ICD-10-CM | POA: Diagnosis not present

## 2021-07-05 DIAGNOSIS — F411 Generalized anxiety disorder: Secondary | ICD-10-CM | POA: Diagnosis not present

## 2021-07-05 MED ORDER — DULOXETINE HCL 30 MG PO CPEP: 30.0000 mg | ORAL_CAPSULE | Freq: Two times a day (BID) | ORAL | 5 refills | Status: DC

## 2021-07-05 NOTE — Progress Notes (Signed)
[facility]  Patient ID: AGRON SWINEY,  MRN: [id number]  PCP: Patient, No Pcp Per (Inactive)  Date of Evaluation: 07/05/2021 Time spent:30 minutes  Chief Complaint:   HISTORY/CURRENT STATUS: HPI 55 year old asian male presents to this office for follow up and medication management. He is former patient of Dr. Beverly Milch retired. He says that he is still doing very well on Cymbalta  and that no changes to his medications are necessary. Says that he is here just for medication check and refills. He reports anxiety 1/10 and depression 1/10.  He is sleeping 7-8 hours every night. Reports no other problems at this time. No mania, no psychosis, no SI/HI.   Past psychiatric medication trials; Lexapro Prozac Zoloft Paxil Wellbutrin    Individual Medical History/ Review of Systems: Changes? :No   Allergies: Hydrocortisone  Current Medications:  Current Outpatient Medications:    DULoxetine (CYMBALTA) 30 MG capsule, Take 1 capsule (30 mg total) by mouth 2 (two) times daily., Disp: 60 capsule, Rfl: 5 Medication Side Effects: none  Family Medical/ Social History: Changes? No  MENTAL HEALTH EXAM:  There were no vitals taken for this visit.There is no height or weight on file to calculate BMI.  General Appearance: Casual  Eye Contact:  Good  Speech:  Clear and Coherent  Volume:  Normal  Mood:  NA  Affect:  Appropriate  Thought Process:   normal  Orientation:  Full (Time, Place, and Person)  Thought Content: Logical   Suicidal Thoughts:  No  Homicidal Thoughts:  No  Memory:  WNL  Judgement:  Good  Insight:  Good  Psychomotor Activity:  Normal  Concentration:  Concentration: Good  Recall:  Good  Fund of Knowledge: Good  Language: Good  Assets:  Desire for Improvement  ADL's:  Intact  Cognition: WNL  Prognosis:  Good    DIAGNOSES:    ICD-10-CM   1. 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 Generalized anxiety disorder  F41.1 DULoxetine (CYMBALTA) 30 MG capsule      Receiving Psychotherapy: No    RECOMMENDATIONS:   Greater than 50% of  30 min face to face time with patient was spent on counseling and coordination of care. We discussed his long history of persistent depression. Cymbalta continue to work exceptionally well for him and no changes are indicated.  Continue Cymbalta 30 mg twice daily Will call if symptoms worsen Follow up in 6 months to reassess Provided emergency contact information Reviewed PDMP     Joan Flores, NP

## 2022-01-02 ENCOUNTER — Ambulatory Visit: Payer: BC Managed Care – PPO | Admitting: Behavioral Health

## 2022-01-18 ENCOUNTER — Other Ambulatory Visit: Payer: Self-pay | Admitting: Behavioral Health

## 2022-01-18 DIAGNOSIS — F411 Generalized anxiety disorder: Secondary | ICD-10-CM

## 2022-01-18 DIAGNOSIS — F341 Dysthymic disorder: Secondary | ICD-10-CM

## 2022-01-19 NOTE — Telephone Encounter (Signed)
Has apt tomorrow, no show in March ?

## 2022-01-20 ENCOUNTER — Telehealth (INDEPENDENT_AMBULATORY_CARE_PROVIDER_SITE_OTHER): Payer: BC Managed Care – PPO | Admitting: Behavioral Health

## 2022-01-20 ENCOUNTER — Encounter: Payer: Self-pay | Admitting: Behavioral Health

## 2022-01-20 DIAGNOSIS — F341 Dysthymic disorder: Secondary | ICD-10-CM | POA: Diagnosis not present

## 2022-01-20 DIAGNOSIS — F411 Generalized anxiety disorder: Secondary | ICD-10-CM

## 2022-01-20 MED ORDER — DULOXETINE HCL 30 MG PO CPEP: 30.0000 mg | ORAL_CAPSULE | Freq: Two times a day (BID) | ORAL | 3 refills | Status: DC

## 2022-01-20 NOTE — Progress Notes (Signed)
Andrew Conner ?[id number] ?1965-11-29 ?56 y.o. ? ?Virtual Visit via Video Note ? ?I connected with pt @ on 01/20/22 at  2:15 PM EDT by a video enabled telemedicine application and verified that I am speaking with the correct person using two identifiers. ?  ?I discussed the limitations of evaluation and management by telemedicine and the availability of in person appointments. The patient expressed understanding and agreed to proceed. ? ?I discussed the assessment and treatment plan with the patient. The patient was provided an opportunity to ask questions and all were answered. The patient agreed with the plan and demonstrated an understanding of the instructions. ?  ?The patient was advised to call back or seek an in-person evaluation if the symptoms worsen or if the condition fails to improve as anticipated. ? ?I provided 20 minutes of non-face-to-face time during this encounter.  The patient was located at home.  The provider was located at [facility]. ? ? ?Andrew Flores, NP  ? ?Subjective:  ? ?Patient ID:  Andrew Conner is a 56 y.o. (DOB 1966-09-14) male. ? ?Chief Complaint:  ?Chief Complaint  ?Patient presents with  ? Anxiety  ? Depression  ? Follow-up  ? Medication Refill  ? ? ?HPI ? ?56 year old asian male presents to this office for follow up and medication management. He is former patient of Dr. Beverly Milch retired. He says that he is still doing very well on Cymbalta  and that no changes to his medications are necessary. Says that he is here just for medication check and refills. He reports anxiety 1/10 and depression 1/10.  He is sleeping 7-8 hours every night. Reports no other problems at this time. No mania, no psychosis, no SI/HI. He request 12 mo f/u.  ?  ?Past psychiatric medication trials; ?Lexapro ?Prozac ?Zoloft ?Paxil ?Wellbutrin ?  ? ? ?Review of Systems:  ?Review of Systems  ?Constitutional: Negative.   ?Musculoskeletal:  Negative for gait problem.  ?Allergic/Immunologic: Negative.    ?Neurological:  Negative for tremors.  ?Psychiatric/Behavioral: Negative.    ? ?Medications: I have reviewed the patient's current medications. ? ?Current Outpatient Medications  ?Medication Sig Dispense Refill  ? DULoxetine (CYMBALTA) 30 MG capsule Take 1 capsule (30 mg total) by mouth 2 (two) times daily. 180 capsule 3  ? ?No current facility-administered medications for this visit.  ? ? ?Medication Side Effects: None ? ?Allergies:  ?Allergies  ?Allergen Reactions  ? Hydrocortisone Hives  ? ? ?No past medical history on file. ? ?No family history on file. ? ?Social History  ? ?Socioeconomic History  ? Marital status: Married  ?  Spouse name: Not on file  ? Number of children: Not on file  ? Years of education: Not on file  ? Highest education level: Not on file  ?Occupational History  ? Not on file  ?Tobacco Use  ? Smoking status: Former  ? Smokeless tobacco: Never  ?Vaping Use  ? Vaping Use: Never used  ?Substance and Sexual Activity  ? Alcohol use: Yes  ?  Alcohol/week: 2.0 - 5.0 standard drinks  ?  Types: 2 - 5 Glasses of wine per week  ? Drug use: Never  ? Sexual activity: Yes  ?Other Topics Concern  ? Not on file  ?Social History Narrative  ? Not on file  ? ?Social Determinants of Health  ? ?Financial Resource Strain: Not on file  ?Food Insecurity: Not on file  ?Transportation Needs: Not on file  ?Physical Activity: Not on  file  ?Stress: Not on file  ?Social Connections: Not on file  ?Intimate Partner Violence: Not on file  ? ? ?Past Medical History, Surgical history, Social history, and Family history were reviewed and updated as appropriate.  ? ?Please see review of systems for further details on the patient's review from today.  ? ?Objective:  ? ?Physical Exam:  ?There were no vitals taken for this visit. ? ?Physical Exam ?Constitutional:   ?   General: He is not in acute distress. ?   Appearance: Normal appearance.  ?Neurological:  ?   Mental Status: He is alert and oriented to person, place, and time.   ?   Gait: Gait normal.  ?Psychiatric:     ?   Attention and Perception: Attention and perception normal. He does not perceive auditory or visual hallucinations.     ?   Mood and Affect: Mood and affect normal. Mood is not anxious or depressed. Affect is not labile.     ?   Speech: Speech normal.     ?   Behavior: Behavior normal. Behavior is cooperative.     ?   Thought Content: Thought content normal.     ?   Cognition and Memory: Cognition and memory normal.     ?   Judgment: Judgment normal.  ? ? ?Lab Review:  ?No results found for: NA, K, CL, CO2, GLUCOSE, BUN, CREATININE, CALCIUM, PROT, ALBUMIN, AST, ALT, ALKPHOS, BILITOT, GFRNONAA, GFRAA ? ?No results found for: WBC, RBC, HGB, HCT, PLT, MCV, MCH, MCHC, RDW, LYMPHSABS, MONOABS, EOSABS, BASOSABS ? ?No results found for: POCLITH, LITHIUM  ? ?No results found for: PHENYTOIN, PHENOBARB, VALPROATE, CBMZ  ? ?.res ?Assessment: Plan:   ? ?Andrew Conner was seen today for anxiety, depression, follow-up and medication refill. ? ?Diagnoses and all orders for this visit: ? 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 ? ?Generalized anxiety disorder ?-     DULoxetine (CYMBALTA) 30 MG capsule; Take 1 capsule (30 mg total) by mouth 2 (two) times daily. ? ?    ?Recommendations: ? ?Greater than 50% of  20 min video visit  with patient was spent on counseling and coordination of care. He continues to do extremely well and symptoms are well controlled. He request annual follow ups.   ?Continue Cymbalta 30 mg twice daily ?Will call if symptoms worsen ?Follow up in 12 months to reassess per pt ?Provided emergency contact information ?Reviewed PDMP ? ?Please see After Visit Summary for patient specific instructions. ? ?No future appointments. ? ?No orders of the defined types were placed in this  encounter. ? ? ?  ?-------------------------------  ?

## 2022-09-01 ENCOUNTER — Telehealth: Payer: BC Managed Care – PPO | Admitting: Physician Assistant

## 2022-09-01 DIAGNOSIS — J069 Acute upper respiratory infection, unspecified: Secondary | ICD-10-CM

## 2022-09-01 MED ORDER — BENZONATATE 100 MG PO CAPS: 100.0000 mg | ORAL_CAPSULE | Freq: Three times a day (TID) | ORAL | 0 refills | Status: AC | PRN

## 2022-09-01 MED ORDER — AZELASTINE HCL 0.1 % NA SOLN: 1.0000 | Freq: Two times a day (BID) | NASAL | 0 refills | Status: AC

## 2022-09-01 NOTE — Progress Notes (Signed)
E-Visit for Upper Respiratory Infection   We are sorry you are not feeling well.  Here is how we plan to help!  Based on what you have shared with me, it looks like you may have a viral upper respiratory infection.  Upper respiratory infections are caused by a large number of viruses; however, rhinovirus is the most common cause.   Symptoms vary from person to person, with common symptoms including sore throat, cough, fatigue or lack of energy and feeling of general discomfort.  A low-grade fever of up to 100.4 may present, but is often uncommon.  Symptoms vary however, and are closely related to a person's age or underlying illnesses.  The most common symptoms associated with an upper respiratory infection are nasal discharge or congestion, cough, sneezing, headache and pressure in the ears and face.  These symptoms usually persist for about 3 to 10 days, but can last up to 2 weeks.  It is important to know that upper respiratory infections do not cause serious illness or complications in most cases.    Upper respiratory infections can be transmitted from person to person, with the most common method of transmission being a person's hands.  The virus is able to live on the skin and can infect other persons for up to 2 hours after direct contact.  Also, these can be transmitted when someone coughs or sneezes; thus, it is important to cover the mouth to reduce this risk.  To keep the spread of the illness at bay, good hand hygiene is very important.  This is an infection that is most likely caused by a virus. There are no specific treatments other than to help you with the symptoms until the infection runs its course.  We are sorry you are not feeling well.  Here is how we plan to help!   For nasal congestion, you may use an oral decongestants such as Mucinex D or if you have glaucoma or high blood pressure use plain Mucinex.  Saline nasal spray or nasal drops can help and can safely be used as often as  needed for congestion.  For your congestion, I have prescribed Azelastine nasal spray two sprays in each nostril twice a day  If you do not have a history of heart disease, hypertension, diabetes or thyroid disease, prostate/bladder issues or glaucoma, you may also use Sudafed to treat nasal congestion.  It is highly recommended that you consult with a pharmacist or your primary care physician to ensure this medication is safe for you to take.     If you have a cough, you may use cough suppressants such as Delsym and Robitussin.  If you have glaucoma or high blood pressure, you can also use Coricidin HBP.   For cough I have prescribed for you A prescription cough medication called Tessalon Perles 100 mg. You may take 1-2 capsules every 8 hours as needed for cough  If you have a sore or scratchy throat, use a saltwater gargle-  to  teaspoon of salt dissolved in a 4-ounce to 8-ounce glass of warm water.  Gargle the solution for approximately 15-30 seconds and then spit.  It is important not to swallow the solution.  You can also use throat lozenges/cough drops and Chloraseptic spray to help with throat pain or discomfort.  Warm or cold liquids can also be helpful in relieving throat pain.  For headache, pain or general discomfort, you can use Ibuprofen or Tylenol as directed.   Some authorities believe   that zinc sprays or the use of Echinacea may shorten the course of your symptoms.   HOME CARE Only take medications as instructed by your medical team. Be sure to drink plenty of fluids. Water is fine as well as fruit juices, sodas and electrolyte beverages. You may want to stay away from caffeine or alcohol. If you are nauseated, try taking small sips of liquids. How do you know if you are getting enough fluid? Your urine should be a pale yellow or almost colorless. Get rest. Taking a steamy shower or using a humidifier may help nasal congestion and ease sore throat pain. You can place a towel over  your head and breathe in the steam from hot water coming from a faucet. Using a saline nasal spray works much the same way. Cough drops, hard candies and sore throat lozenges may ease your cough. Avoid close contacts especially the very young and the elderly Cover your mouth if you cough or sneeze Always remember to wash your hands.   GET HELP RIGHT AWAY IF: You develop worsening fever. If your symptoms do not improve within 10 days You develop yellow or green discharge from your nose over 3 days. You have coughing fits You develop a severe head ache or visual changes. You develop shortness of breath, difficulty breathing or start having chest pain Your symptoms persist after you have completed your treatment plan  MAKE SURE YOU  Understand these instructions. Will watch your condition. Will get help right away if you are not doing well or get worse.  Thank you for choosing an e-visit.  Your e-visit answers were reviewed by a board certified advanced clinical practitioner to complete your personal care plan. Depending upon the condition, your plan could have included both over the counter or prescription medications.  Please review your pharmacy choice. Make sure the pharmacy is open so you can pick up prescription now. If there is a problem, you may contact your provider through MyChart messaging and have the prescription routed to another pharmacy.  Your safety is important to us. If you have drug allergies check your prescription carefully.   For the next 24 hours you can use MyChart to ask questions about today's visit, request a non-urgent call back, or ask for a work or school excuse. You will get an email in the next two days asking about your experience. I hope that your e-visit has been valuable and will speed your recovery.  I have spent 5 minutes in review of e-visit questionnaire, review and updating patient chart, medical decision making and response to patient.    Alex Mcmanigal M Petros Ahart, PA-C  

## 2023-02-14 ENCOUNTER — Other Ambulatory Visit: Payer: Self-pay | Admitting: Behavioral Health

## 2023-02-14 DIAGNOSIS — F341 Dysthymic disorder: Secondary | ICD-10-CM

## 2023-02-14 DIAGNOSIS — F411 Generalized anxiety disorder: Secondary | ICD-10-CM

## 2023-02-15 NOTE — Telephone Encounter (Signed)
Please call to schedule an appt Yearly F/U but was due in April.

## 2023-02-23 NOTE — Telephone Encounter (Signed)
Appt made 5/21

## 2023-02-27 ENCOUNTER — Telehealth (INDEPENDENT_AMBULATORY_CARE_PROVIDER_SITE_OTHER): Payer: BC Managed Care – PPO | Admitting: Behavioral Health

## 2023-02-27 ENCOUNTER — Encounter: Payer: Self-pay | Admitting: Behavioral Health

## 2023-02-27 DIAGNOSIS — F341 Dysthymic disorder: Secondary | ICD-10-CM | POA: Diagnosis not present

## 2023-02-27 DIAGNOSIS — F411 Generalized anxiety disorder: Secondary | ICD-10-CM

## 2023-02-27 MED ORDER — DULOXETINE HCL 30 MG PO CPEP: 30.0000 mg | ORAL_CAPSULE | Freq: Two times a day (BID) | ORAL | 3 refills | Status: DC

## 2023-02-27 NOTE — Progress Notes (Deleted)
[facility]  Patient ID: Andrew Conner,  MRN: [id number]  PCP: Patient, No Pcp Per  Date of Evaluation: 02/27/2023 Time spent:30 minutes  Chief Complaint:  Chief Complaint   Anxiety; Depression; Follow-up; Medication Refill; Patient Education     HISTORY/CURRENT STATUS: HPI 57  year old asian male presents to this office for follow up and medication management. He says that he is still doing very well on Cymbalta  and that no changes to his medications are necessary. Says that he is here just for medication check and refills. He reports anxiety 1/10 and depression 1/10.  He is sleeping 7-8 hours every night. Reports no other problems at this time. No mania, no psychosis, no SI/HI. He request 12 mo f/u.    Past psychiatric medication trials; Lexapro Prozac Zoloft Paxil Wellbutrin      Individual Medical History/ Review of Systems: Changes? :No   Allergies: Hydrocortisone  Current Medications:  Current Outpatient Medications:    azelastine (ASTELIN) 0.1 % nasal spray, Place 1 spray into both nostrils 2 (two) times daily. Use in each nostril as directed, Disp: 30 mL, Rfl: 0   benzonatate (TESSALON) 100 MG capsule, Take 1 capsule (100 mg total) by mouth 3 (three) times daily as needed., Disp: 30 capsule, Rfl: 0   DULoxetine (CYMBALTA) 30 MG capsule, Take 1 capsule (30 mg total) by mouth 2 (two) times daily., Disp: 180 capsule, Rfl: 3 Medication Side Effects: none  Family Medical/ Social History: Changes? No  MENTAL HEALTH EXAM:  There were no vitals taken for this visit.There is no height or weight on file to calculate BMI.  General Appearance: Casual, Neat, and Well Groomed  Eye Contact:  Good  Speech:  Clear and Coherent  Volume:  Normal  Mood:  NA  Affect:  Appropriate  Thought Process:  Coherent  Orientation:  Full (Time, Place, and Person)  Thought Content: Logical   Suicidal Thoughts:  No  Homicidal Thoughts:  No  Memory:  WNL  Judgement:  Good   Insight:  Good  Psychomotor Activity:  Normal  Concentration:  Concentration: Good  Recall:  Good  Fund of Knowledge: Good  Language: Good  Assets:  Desire for Improvement  ADL's:  Intact  Cognition: WNL  Prognosis:  Good    DIAGNOSES:    ICD-10-CM   1. Generalized anxiety disorder  F41.1 DULoxetine (CYMBALTA) 30 MG capsule    2. 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 He continues to do extremely well and symptoms are well controlled. He request annual follow ups.   Continue Cymbalta 30 mg twice daily Will call if symptoms worsen Follow up in 12 months to reassess per pt Provided emergency contact information Reviewed PDMP   Joan Flores, NP

## 2023-03-16 NOTE — Progress Notes (Signed)
RAWLAND SERINO [id number] 01/27/1966 57 y.o.  Virtual Visit via Video Note  I connected with pt @ on 02/27/23 at  8:30 AM EDT by a video enabled telemedicine application and verified that I am speaking with the correct person using two identifiers.   I discussed the limitations of evaluation and management by telemedicine and the availability of in person appointments. The patient expressed understanding and agreed to proceed.  I discussed the assessment and treatment plan with the patient. The patient was provided an opportunity to ask questions and all were answered. The patient agreed with the plan and demonstrated an understanding of the instructions.   The patient was advised to call back or seek an in-person evaluation if the symptoms worsen or if the condition fails to improve as anticipated.  I provided 20 minutes of non-face-to-face time during this encounter.  The patient was located at home.  The provider was located at [facility].   Joan Flores, NP   Subjective:   Patient ID:  AJDIN LALLO is a 57 y.o. (DOB Feb 24, 1966) male.  Chief Complaint:  Chief Complaint  Patient presents with   Anxiety   Depression   Follow-up   Medication Refill   Patient Education    HPI 57  year old asian male presents to this office for follow up and medication management. He says that he is still doing very well on Cymbalta  and that no changes to his medications are necessary. Says that he is here just for medication check and refills. He reports anxiety 1/10 and depression 1/10.  He is sleeping 7-8 hours every night. Reports no other problems at this time. No mania, no psychosis, no SI/HI. He request 12 mo f/u.    Past psychiatric medication trials; Lexapro Prozac Zoloft Paxil Wellbutrin   Review of Systems:  Review of Systems  Medications: I have reviewed the patient's current medications.  Current Outpatient Medications  Medication Sig Dispense Refill    azelastine (ASTELIN) 0.1 % nasal spray Place 1 spray into both nostrils 2 (two) times daily. Use in each nostril as directed 30 mL 0   benzonatate (TESSALON) 100 MG capsule Take 1 capsule (100 mg total) by mouth 3 (three) times daily as needed. 30 capsule 0   DULoxetine (CYMBALTA) 30 MG capsule Take 1 capsule (30 mg total) by mouth 2 (two) times daily. 180 capsule 3   No current facility-administered medications for this visit.    Medication Side Effects: None  Allergies:  Allergies  Allergen Reactions   Hydrocortisone Hives    History reviewed. No pertinent past medical history.  History reviewed. No pertinent family history.  Social History   Socioeconomic History   Marital status: Married    Spouse name: Not on file   Number of children: Not on file   Years of education: Not on file   Highest education level: Not on file  Occupational History   Not on file  Tobacco Use   Smoking status: Former   Smokeless tobacco: Never  Vaping Use   Vaping Use: Never used  Substance and Sexual Activity   Alcohol use: Yes    Alcohol/week: 2.0 - 5.0 standard drinks of alcohol    Types: 2 - 5 Glasses of wine per week   Drug use: Never   Sexual activity: Yes  Other Topics Concern   Not on file  Social History Narrative   Not on file   Social Determinants of Health   Financial Resource  Strain: Not on file  Food Insecurity: Not on file  Transportation Needs: Not on file  Physical Activity: Not on file  Stress: Not on file  Social Connections: Not on file  Intimate Partner Violence: Not on file    Past Medical History, Surgical history, Social history, and Family history were reviewed and updated as appropriate.   Please see review of systems for further details on the patient's review from today.   Objective:   Physical Exam:  There were no vitals taken for this visit.  Physical Exam  Lab Review:  No results found for: "NA", "K", "CL", "CO2", "GLUCOSE", "BUN",  "CREATININE", "CALCIUM", "PROT", "ALBUMIN", "AST", "ALT", "ALKPHOS", "BILITOT", "GFRNONAA", "GFRAA"  No results found for: "WBC", "RBC", "HGB", "HCT", "PLT", "MCV", "MCH", "MCHC", "RDW", "LYMPHSABS", "MONOABS", "EOSABS", "BASOSABS"  No results found for: "POCLITH", "LITHIUM"   No results found for: "PHENYTOIN", "PHENOBARB", "VALPROATE", "CBMZ"   .res Assessment: Plan:    RECOMMENDATIONS:    Greater than 50% of  20 min video visit  with patient was spent on counseling and coordination of care. He continues to do extremely well and symptoms are well controlled. He request annual follow ups.   Continue Cymbalta 30 mg twice daily Will call if symptoms worsen Follow up in 12 months to reassess per pt Provided emergency contact information Reviewed PDMP   Arlys John A. Vasili Fok    Leandre was seen today for anxiety, depression, follow-up, medication refill and patient education.  Diagnoses and all orders for this visit:  Generalized anxiety disorder -     DULoxetine (CYMBALTA) 30 MG capsule; Take 1 capsule (30 mg total) by mouth 2 (two) times daily.  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     Please see After Visit Summary for patient specific instructions.  No future appointments.  No orders of the defined types were placed in this encounter.     -------------------------------

## 2024-01-13 ENCOUNTER — Other Ambulatory Visit: Payer: Self-pay | Admitting: Behavioral Health

## 2024-01-13 DIAGNOSIS — F411 Generalized anxiety disorder: Secondary | ICD-10-CM

## 2024-01-13 DIAGNOSIS — F341 Dysthymic disorder: Secondary | ICD-10-CM

## 2024-01-31 ENCOUNTER — Encounter: Payer: Self-pay | Admitting: Behavioral Health

## 2024-01-31 ENCOUNTER — Telehealth: Admitting: Behavioral Health

## 2024-01-31 DIAGNOSIS — F411 Generalized anxiety disorder: Secondary | ICD-10-CM | POA: Diagnosis not present

## 2024-01-31 DIAGNOSIS — F341 Dysthymic disorder: Secondary | ICD-10-CM

## 2024-01-31 MED ORDER — DULOXETINE HCL 30 MG PO CPEP: 30.0000 mg | ORAL_CAPSULE | Freq: Two times a day (BID) | ORAL | 3 refills | Status: AC

## 2024-01-31 NOTE — Progress Notes (Signed)
 Andrew Conner [id number] [birth year] 58 y.o.  Virtual Visit via Video Note  I connected with pt @ on 01/31/24 at  9:00 AM EDT by a video enabled telemedicine application and verified that I am speaking with the correct person using two identifiers.   I discussed the limitations of evaluation and management by telemedicine and the availability of in person appointments. The patient expressed understanding and agreed to proceed.  I discussed the assessment and treatment plan with the patient. The patient was provided an opportunity to ask questions and all were answered. The patient agreed with the plan and demonstrated an understanding of the instructions.   The patient was advised to call back or seek an in-person evaluation if the symptoms worsen or if the condition fails to improve as anticipated.  I provided 30 minutes of non-face-to-face time during this encounter.  The patient was located at home.  The provider was located at [facility].   Lincoln Renshaw, NP   Subjective:   Patient ID:  Andrew Conner is a 58 y.o. (DOB 1966/07/09) male.  Chief Complaint: No chief complaint on file.   HPI 58 year old asian male presents to this office for follow up and medication management. He says that he is still doing very well on Cymbalta   and that no changes to his medications are necessary. He did have some questions about whether medication could effect long term memory.  Noticing some mild decline but believes it has improved since reducing ETOH.  He reports anxiety 1/10 and depression 1/10.  He is sleeping 7-8 hours every night. Reports no other problems at this time. No mania, no psychosis, no SI/HI. He request 12 mo f/u.    Past psychiatric medication trials; Lexapro Prozac Zoloft Paxil Wellbutrin   Review of Systems:  Review of Systems  Constitutional: Negative.   Allergic/Immunologic: Negative.   Neurological: Negative.     Medications: I have reviewed the  patient's current medications.  Current Outpatient Medications  Medication Sig Dispense Refill   azelastine  (ASTELIN ) 0.1 % nasal spray Place 1 spray into both nostrils 2 (two) times daily. Use in each nostril as directed 30 mL 0   benzonatate  (TESSALON ) 100 MG capsule Take 1 capsule (100 mg total) by mouth 3 (three) times daily as needed. 30 capsule 0   DULoxetine  (CYMBALTA ) 30 MG capsule Take 1 capsule (30 mg total) by mouth 2 (two) times daily. 180 capsule 3   No current facility-administered medications for this visit.    Medication Side Effects: None  Allergies:  Allergies  Allergen Reactions   Hydrocortisone Hives    No past medical history on file.  No family history on file.  Social History   Socioeconomic History   Marital status: Married    Spouse name: Not on file   Number of children: Not on file   Years of education: Not on file   Highest education level: Not on file  Occupational History   Not on file  Tobacco Use   Smoking status: Former   Smokeless tobacco: Never  Vaping Use   Vaping status: Never Used  Substance and Sexual Activity   Alcohol use: Yes    Alcohol/week: 2.0 - 5.0 standard drinks of alcohol    Types: 2 - 5 Glasses of wine per week   Drug use: Never   Sexual activity: Yes  Other Topics Concern   Not on file  Social History Narrative   Not on file   Social  Drivers of Corporate investment banker Strain: Not on file  Food Insecurity: Not on file  Transportation Needs: Not on file  Physical Activity: Not on file  Stress: Not on file  Social Connections: Unknown (02/19/2022)   [facility] from [facility]   Social Network    Social Network: Not on file  Intimate Partner Violence: Unknown (01/11/2022)   Received from [facility]   HITS    Physically Hurt: Not on file    Insult or Talk Down To: Not on file    Threaten Physical Harm: Not on file    Scream or Curse: Not on file    Past Medical  History, Surgical history, Social history, and Family history were reviewed and updated as appropriate.   Please see review of systems for further details on the patient's review from today.   Objective:   Physical Exam:  There were no vitals taken for this visit.  Physical Exam Constitutional:      General: He is not in acute distress.    Appearance: Normal appearance.  Neurological:     Mental Status: He is alert and oriented to person, place, and time.     Gait: Gait normal.  Psychiatric:        Attention and Perception: Attention and perception normal. He does not perceive auditory or visual hallucinations.        Mood and Affect: Mood and affect normal. Mood is not anxious or depressed. Affect is not labile.        Speech: Speech normal.        Behavior: Behavior normal. Behavior is cooperative.        Thought Content: Thought content normal.        Cognition and Memory: Cognition and memory normal.        Judgment: Judgment normal.     Lab Review:  No results found for: "NA", "K", "CL", "CO2", "GLUCOSE", "BUN", "CREATININE", "CALCIUM", "PROT", "ALBUMIN", "AST", "ALT", "ALKPHOS", "BILITOT", "GFRNONAA", "GFRAA"  No results found for: "WBC", "RBC", "HGB", "HCT", "PLT", "MCV", "MCH", "MCHC", "RDW", "LYMPHSABS", "MONOABS", "EOSABS", "BASOSABS"  No results found for: "POCLITH", "LITHIUM"   No results found for: "PHENYTOIN", "PHENOBARB", "VALPROATE", "CBMZ"   .res Assessment: Plan:    RECOMMENDATIONS:    Greater than 50% of  20 min video visit  with patient was spent on counseling and coordination of care. He continues to do extremely well and symptoms are well controlled. He request annual follow ups.   Continue Cymbalta  30 mg twice daily Will call if symptoms worsen Follow up in 12 months to reassess per pt Provided emergency contact information Reviewed PDMP     Polly Brink A. Brahim Dolman     Diagnoses and all orders for this visit:  Severe late onset persistent  depressive disorder in partial remission with anxious distress and pure persistent depressive syndrome  Generalized anxiety disorder     Please see After Visit Summary for patient specific instructions.  Future Appointments  Date Time Provider Department Center  01/30/2025  9:30 AM Marita Sidle A, NP [facility] None    No orders of the defined types were placed in this encounter.     -------------------------------

## 2024-01-31 NOTE — Progress Notes (Deleted)
 [facility]  Patient ID: Andrew Conner,  MRN: [id number]  PCP: Patient, No Pcp Per  Date of Evaluation: 01/31/2024 Time spent:{TIME; 0 MIN TO 60 MIN:[phone]}  Chief Complaint:   HISTORY/CURRENT STATUS: HPI  Individual Medical History/ Review of Systems: Changes? :{EXAM; YES/NO:21197}  Allergies: Hydrocortisone  Current Medications:  Current Outpatient Medications:    azelastine  (ASTELIN ) 0.1 % nasal spray, Place 1 spray into both nostrils 2 (two) times daily. Use in each nostril as directed, Disp: 30 mL, Rfl: 0   benzonatate  (TESSALON ) 100 MG capsule, Take 1 capsule (100 mg total) by mouth 3 (three) times daily as needed., Disp: 30 capsule, Rfl: 0   DULoxetine  (CYMBALTA ) 30 MG capsule, Take 1 capsule (30 mg total) by mouth 2 (two) times daily., Disp: 180 capsule, Rfl: 3 Medication Side Effects: {Medication Side Effects (Optional):12147}  Family Medical/ Social History: Changes? {EXAM; YES/NO:19492}  MENTAL HEALTH EXAM:  There were no vitals taken for this visit.There is no height or weight on file to calculate BMI.  General Appearance: {PSY:[phone]}  Eye Contact:  {PSY:22684}  Speech:  {PSY:[phone]}  Volume:  {PSY:22686}  Mood:  {PSY:22306}  Affect:  {[phone]}  Thought Process:  {PSY:22688}  Orientation:  {PSY:22689}  Thought Content: {PSYt:22690}   Suicidal Thoughts:  {PSY:22692}  Homicidal Thoughts:  {PSY:22692}  Memory:  {PSY:[id number]}  Judgement:  {PSY:22694}  Insight:  {PSY:22695}  Psychomotor Activity:  {PSY:22696}  Concentration:  {PSY:21399}  Recall:  {PSY:22877}  Fund of Knowledge: {PSY:22877}  Language: {ZOX:09604}  Assets:  {PSY:22698}  ADL's:  {PSY:22290}  Cognition: {PSY:304700322}  Prognosis:  {PSY:22877}    DIAGNOSES: No diagnosis found.  Receiving Psychotherapy: {VWU:98119}   RECOMMENDATIONS: ***   Lincoln Renshaw, NP

## 2025-01-30 ENCOUNTER — Telehealth: Admitting: Behavioral Health
